# Patient Record
Sex: Male | Born: 1995 | Race: Black or African American | Hispanic: No | Marital: Single | Smoking: Never smoker
Health system: Southern US, Community
[De-identification: ages and names within clinical notes are randomized; demographics above are authoritative.]

## PROBLEM LIST (undated history)

## (undated) DIAGNOSIS — S0003XA Contusion of scalp, initial encounter: Secondary | ICD-10-CM

## (undated) DIAGNOSIS — R51 Headache: Secondary | ICD-10-CM

---
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
## 2014-07-12 ENCOUNTER — Emergency Department (HOSPITAL_BASED_OUTPATIENT_CLINIC_OR_DEPARTMENT_OTHER): Payer: Self-pay

## 2014-07-12 ENCOUNTER — Encounter (HOSPITAL_BASED_OUTPATIENT_CLINIC_OR_DEPARTMENT_OTHER): Payer: Self-pay | Admitting: *Deleted

## 2014-07-12 ENCOUNTER — Emergency Department (HOSPITAL_BASED_OUTPATIENT_CLINIC_OR_DEPARTMENT_OTHER)
Admission: EM | Admit: 2014-07-12 | Discharge: 2014-07-12 | Disposition: A | Discharge status: Home / Self Care | Payer: Self-pay | Attending: Emergency Medicine | Admitting: Emergency Medicine

## 2014-07-12 DIAGNOSIS — Y9289 Other specified places as the place of occurrence of the external cause: Secondary | ICD-10-CM | POA: Insufficient documentation

## 2014-07-12 DIAGNOSIS — R51 Headache: Secondary | ICD-10-CM

## 2014-07-12 DIAGNOSIS — S0990XA Unspecified injury of head, initial encounter: Secondary | ICD-10-CM | POA: Insufficient documentation

## 2014-07-12 DIAGNOSIS — R519 Headache, unspecified: Secondary | ICD-10-CM

## 2014-07-12 DIAGNOSIS — S41112A Laceration without foreign body of left upper arm, initial encounter: Secondary | ICD-10-CM | POA: Insufficient documentation

## 2014-07-12 DIAGNOSIS — S20319A Abrasion of unspecified front wall of thorax, initial encounter: Secondary | ICD-10-CM | POA: Insufficient documentation

## 2014-07-12 DIAGNOSIS — Z23 Encounter for immunization: Secondary | ICD-10-CM | POA: Insufficient documentation

## 2014-07-12 DIAGNOSIS — Z79899 Other long term (current) drug therapy: Secondary | ICD-10-CM | POA: Insufficient documentation

## 2014-07-12 DIAGNOSIS — Y9389 Activity, other specified: Secondary | ICD-10-CM | POA: Insufficient documentation

## 2014-07-12 DIAGNOSIS — S0181XA Laceration without foreign body of other part of head, initial encounter: Secondary | ICD-10-CM | POA: Insufficient documentation

## 2014-07-12 DIAGNOSIS — S1091XA Abrasion of unspecified part of neck, initial encounter: Secondary | ICD-10-CM | POA: Insufficient documentation

## 2014-07-12 DIAGNOSIS — Y998 Other external cause status: Secondary | ICD-10-CM | POA: Insufficient documentation

## 2014-07-12 MED ORDER — LIDOCAINE HCL (PF) 2 % IJ SOLN
INTRAMUSCULAR | Status: AC
  Filled 2014-07-12: qty 4

## 2014-07-12 MED ORDER — TETANUS-DIPHTH-ACELL PERTUSSIS 5-2.5-18.5 LF-MCG/0.5 IM SUSP
INTRAMUSCULAR | Status: AC
  Filled 2014-07-12: qty 0.5

## 2014-07-12 MED ORDER — ONDANSETRON 4 MG PO TBDP
4.0000 mg | ORAL_TABLET | Freq: Once | ORAL | Status: AC
  Administered 2014-07-12: 4 mg via ORAL

## 2014-07-12 MED ORDER — ONDANSETRON 4 MG PO TBDP
ORAL_TABLET | ORAL | Status: AC
  Filled 2014-07-12: qty 1

## 2014-07-12 MED ORDER — IBUPROFEN 800 MG PO TABS
800.0000 mg | ORAL_TABLET | Freq: Once | ORAL | Status: AC
  Administered 2014-07-12: 800 mg via ORAL
  Filled 2014-07-12: qty 1

## 2014-07-12 MED ORDER — LIDOCAINE HCL 2 % IJ SOLN
10.0000 mL | Freq: Once | INTRAMUSCULAR | Status: AC
  Administered 2014-07-12: 200 mg
  Filled 2014-07-12: qty 20

## 2014-07-12 MED ORDER — TETANUS-DIPHTHERIA TOXOIDS TD 5-2 LFU IM INJ
INJECTION | INTRAMUSCULAR | Status: AC
  Administered 2014-07-12: 0.5 mL via INTRAMUSCULAR
  Filled 2014-07-12: qty 0.5

## 2014-07-12 NOTE — ED Notes (Signed)
Pt and Family verbalize understanding of d/c instructions. Reminded him to Keep hands off lacerations.
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
## 2014-07-12 NOTE — ED Notes (Signed)
Pt given TDAP shot prior to D/C.  Per MD's order

## 2014-07-12 NOTE — ED Notes (Signed)
MD at bedside suturing

## 2014-07-12 NOTE — Discharge Instructions (Signed)
Patient is advised to keep wounds clean and covered for the next 24 hours. Thereafter gentle wash with warm water and soap confused followed by bacitracin ointment, was covering after. Continue this for 3 days until wounds are closed. Other wounds were left open as they were not able to be closed though should be cleaned tonight with warm water and soap followed by bacitracin, continue this daily suture should be removed the face 4-5 days to avoid scarring or crusting over. Sutures on the hand should be left for 7 days. Please monitor for signs of infection including fever, redness, warmth, discharge, swelling if any of the above symptoms present please seek immediate medical care. Continue using over-the-counter medication for her headache monitor for worsening signs or symptoms and return if any present.
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
## 2014-07-18 ENCOUNTER — Encounter (HOSPITAL_BASED_OUTPATIENT_CLINIC_OR_DEPARTMENT_OTHER): Payer: Self-pay

## 2014-07-18 ENCOUNTER — Emergency Department (HOSPITAL_BASED_OUTPATIENT_CLINIC_OR_DEPARTMENT_OTHER)
Admission: EM | Admit: 2014-07-18 | Discharge: 2014-07-18 | Disposition: A | Discharge status: Home / Self Care | Payer: Self-pay | Attending: Emergency Medicine | Admitting: Emergency Medicine

## 2014-07-18 DIAGNOSIS — Z87828 Personal history of other (healed) physical injury and trauma: Secondary | ICD-10-CM | POA: Insufficient documentation

## 2014-07-18 DIAGNOSIS — Z79899 Other long term (current) drug therapy: Secondary | ICD-10-CM | POA: Insufficient documentation

## 2014-07-18 DIAGNOSIS — Z4802 Encounter for removal of sutures: Secondary | ICD-10-CM | POA: Insufficient documentation

## 2014-07-18 NOTE — Discharge Instructions (Signed)
If you were given medicines take as directed.  If you are on coumadin or contraceptives realize their levels and effectiveness is altered by many different medicines.  If you have any reaction (rash, tongues swelling, other) to the medicines stop taking and see a physician.   Please follow up as directed and return to the ER or see a physician for new or worsening symptoms.  Thank you. Filed Vitals:   07/18/14 1329  BP: 136/74  Pulse: 53  Temp: 98.5 F (36.9 C)  TempSrc: Oral  Resp: 16  Height: 6' (1.829 m)  Weight: 185 lb (83.915 kg)  SpO2: 99%
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
## 2014-07-18 NOTE — ED Notes (Signed)
For suture removal-placed 4/26-area around right eye and left finger x 2

## 2015-07-23 ENCOUNTER — Encounter (HOSPITAL_BASED_OUTPATIENT_CLINIC_OR_DEPARTMENT_OTHER): Payer: Self-pay | Admitting: *Deleted

## 2015-07-23 ENCOUNTER — Emergency Department (HOSPITAL_BASED_OUTPATIENT_CLINIC_OR_DEPARTMENT_OTHER)
Admission: EM | Admit: 2015-07-23 | Discharge: 2015-07-24 | Disposition: A | Discharge status: Home / Self Care | Payer: Self-pay | Attending: Emergency Medicine | Admitting: Emergency Medicine

## 2015-07-23 DIAGNOSIS — R112 Nausea with vomiting, unspecified: Secondary | ICD-10-CM | POA: Insufficient documentation

## 2015-07-23 DIAGNOSIS — G43009 Migraine without aura, not intractable, without status migrainosus: Secondary | ICD-10-CM

## 2015-07-23 DIAGNOSIS — R51 Headache: Secondary | ICD-10-CM | POA: Insufficient documentation

## 2015-07-23 DIAGNOSIS — H53149 Visual discomfort, unspecified: Secondary | ICD-10-CM | POA: Insufficient documentation

## 2015-07-23 MED ORDER — DEXAMETHASONE SODIUM PHOSPHATE 10 MG/ML IJ SOLN
10.0000 mg | Freq: Once | INTRAMUSCULAR | Status: AC
  Administered 2015-07-24: 10 mg via INTRAVENOUS
  Filled 2015-07-23: qty 1

## 2015-07-23 MED ORDER — METOCLOPRAMIDE HCL 5 MG/ML IJ SOLN
10.0000 mg | Freq: Once | INTRAMUSCULAR | Status: AC
  Administered 2015-07-24: 10 mg via INTRAVENOUS
  Filled 2015-07-23: qty 2

## 2015-07-23 MED ORDER — DIPHENHYDRAMINE HCL 50 MG/ML IJ SOLN
25.0000 mg | Freq: Once | INTRAMUSCULAR | Status: AC
  Administered 2015-07-24: 25 mg via INTRAVENOUS
  Filled 2015-07-23: qty 1

## 2015-07-23 MED ORDER — KETOROLAC TROMETHAMINE 30 MG/ML IJ SOLN
30.0000 mg | Freq: Once | INTRAMUSCULAR | Status: AC
  Administered 2015-07-24: 30 mg via INTRAVENOUS
  Filled 2015-07-23: qty 1
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
## 2015-07-23 NOTE — ED Provider Notes (Signed)
CSN: [id number]     Arrival date & time 07/23/15  2332 History  By signing my name below, I, [name], attest that this documentation has been prepared under the direction and in the presence of Thiago Ragsdale, MD. Electronically Signed: [name], ED Scribe. 07/23/2015. 11:56 PM.  Chief Complaint  Patient presents with  . Headache   Patient is a 20 y.o. male presenting with headaches. The history is provided by the patient. No language interpreter was used.  Headache Pain location:  L temporal Quality:  Sharp Radiates to:  Does not radiate Onset quality:  Sudden Duration:  2 hours Timing:  Constant Progression:  Worsening Chronicity:  New Similar to prior headaches: yes   Context: bright light   Ineffective treatments:  Acetaminophen Associated symptoms: nausea, photophobia and vomiting   Associated symptoms: no dizziness, no fever, no neck pain, no neck stiffness, no numbness, no seizures and no weakness   HPI Comments: Joe Wright is a 20 y.o. male with a hx of headache who presents to the Emergency Department complaining of a gradually worsening, sharp left temporal headache onset 2 hours ago. Pt reports associated nausea, vomiting, and photophobia. This headache is similar to past headaches that he has had. Pt has taken Zyrtec and Tylenol for pain to no relief. He denies other symptoms. Not sudden onset not the worst headache of his life.  No neck pain or stiffness.  No atypia.  No fevers no focal neuro deficits.    Past Medical History  Diagnosis Date  . 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 Headache(784.0)    History reviewed. No pertinent past surgical history. History reviewed. No pertinent family history. Social History  Substance Use Topics  . Smoking status: Never Smoker   . Smokeless tobacco: None  . Alcohol Use: No    Review of Systems  Constitutional: Negative for fever.  Eyes: Positive for photophobia.  Gastrointestinal: Positive for nausea and  vomiting.  Musculoskeletal: Negative for neck pain and neck stiffness.  Skin: Negative for rash.  Neurological: Positive for headaches. Negative for dizziness, seizures, syncope, facial asymmetry, speech difficulty, weakness, light-headedness and numbness.  All other systems reviewed and are negative.     Allergies  Review of patient's allergies indicates no known allergies.  Home Medications   Prior to Admission medications   Medication Sig Start Date End Date Taking? Authorizing Provider  cetirizine (ZYRTEC) 10 MG tablet Take 10 mg by mouth daily.      Historical Provider, MD  Homeopathic Products [facility](ZICAM ALLERGY RELIEF) GEL Place into the nose.      Historical Provider, MD  ibuprofen (ADVIL,MOTRIN) 200 MG tablet Take 200 mg by mouth every 6 (six) hours as needed.      Historical Provider, MD   BP 137/91 mmHg  Pulse 76  Temp(Src) 98 F (36.7 C) (Oral)  Resp 16  Ht 6' (1.829 m)  Wt 185 lb (83.915 kg)  BMI 25.08 kg/m2  SpO2 97% Physical Exam  Constitutional: He is oriented to person, place, and time. He appears well-developed and well-nourished. No distress.  HENT:  Head: Normocephalic and atraumatic.  Mouth/Throat: Oropharynx is clear and moist. No oropharyngeal exudate.  Trachea midline  Eyes: Conjunctivae and EOM are normal. Pupils are equal, round, and reactive to light.  Neck: Trachea normal and normal range of motion. Neck supple. No JVD present. Carotid bruit is not present.  Cardiovascular: Normal rate and regular rhythm.  Exam reveals no gallop and no  friction rub.   No murmur heard. Pulmonary/Chest: Effort normal and breath sounds normal. No stridor. He has no wheezes. He has no rales.  Abdominal: Soft. Bowel sounds are normal. He exhibits no mass. There is no tenderness. There is no rebound and no guarding.  Musculoskeletal: Normal range of motion.  Lymphadenopathy:    He has no cervical adenopathy.  Neurological: He is alert and oriented to person, place, and  time. He has normal reflexes. No cranial nerve deficit. He exhibits normal muscle tone. Coordination normal.  Cranial nerves 2-12 intact  Skin: Skin is warm and dry. He is not diaphoretic.  Psychiatric: He has a normal mood and affect. His behavior is normal.    ED Course  Procedures (including critical care time) DIAGNOSTIC STUDIES: Oxygen Saturation is 97% on RA, normal by my interpretation.    COORDINATION OF CARE: 11:56 PM-Discussed treatment plan with pt at bedside and pt agreed to plan.    Labs Review Labs Reviewed - No data to display  Imaging Review No results found. I have personally reviewed and evaluated these images and lab results as part of my medical decision-making.   EKG Interpretation None      MDM   Filed Vitals:   07/23/15 2336  BP: 137/91  Pulse: 76  Temp: 98 F (36.7 C)  Resp: 16   Medications - No data to display  Final diagnoses:  None   Filed Vitals:   07/24/15 0004 07/24/15 0030  BP:  130/77  Pulse: 51 65  Temp:    Resp:     Medications  ketorolac (TORADOL) 30 MG/ML injection 30 mg (30 mg Intravenous Given 07/24/15 0012)  dexamethasone (DECADRON) injection 10 mg (10 mg Intravenous Given 07/24/15 0012)  metoCLOPramide (REGLAN) injection 10 mg (10 mg Intravenous Given 07/24/15 0013)  diphenhydrAMINE (BENADRYL) injection 25 mg (25 mg Intravenous Given 07/24/15 0012)   Well appearing.  No atypia.  Has same headaches frequently.  Sleeping soundly post medication.  No indication for CT or LP at this time.  Stable for discharge with close follow up.  Strict return precautions  I personally performed the services described in this documentation, which was scribed in my presence. The recorded information has been reviewed and is accurate.      Cy Blamer, MD 07/24/15 [phone]

## 2015-07-24 ENCOUNTER — Encounter (HOSPITAL_BASED_OUTPATIENT_CLINIC_OR_DEPARTMENT_OTHER): Payer: Self-pay | Admitting: Emergency Medicine

## 2015-07-24 MED ORDER — NAPROXEN 375 MG PO TABS: 375.0000 mg | ORAL_TABLET | Freq: Two times a day (BID) | ORAL | Status: DC

## 2015-07-24 NOTE — Discharge Instructions (Signed)

## 2015-12-18 ENCOUNTER — Emergency Department (HOSPITAL_BASED_OUTPATIENT_CLINIC_OR_DEPARTMENT_OTHER)
Admission: EM | Admit: 2015-12-18 | Discharge: 2015-12-19 | Disposition: A | Discharge status: Home / Self Care | Payer: No Typology Code available for payment source | Attending: Emergency Medicine | Admitting: Emergency Medicine

## 2015-12-18 ENCOUNTER — Encounter (HOSPITAL_BASED_OUTPATIENT_CLINIC_OR_DEPARTMENT_OTHER): Payer: Self-pay

## 2015-12-18 DIAGNOSIS — R51 Headache: Secondary | ICD-10-CM | POA: Insufficient documentation

## 2015-12-18 DIAGNOSIS — Y9389 Activity, other specified: Secondary | ICD-10-CM | POA: Diagnosis not present

## 2015-12-18 DIAGNOSIS — Y92410 Unspecified street and highway as the place of occurrence of the external cause: Secondary | ICD-10-CM | POA: Insufficient documentation

## 2015-12-18 DIAGNOSIS — Z041 Encounter for examination and observation following transport accident: Secondary | ICD-10-CM | POA: Diagnosis present

## 2015-12-18 DIAGNOSIS — R110 Nausea: Secondary | ICD-10-CM | POA: Diagnosis not present

## 2015-12-18 DIAGNOSIS — M25512 Pain in left shoulder: Secondary | ICD-10-CM | POA: Insufficient documentation

## 2015-12-18 DIAGNOSIS — S70312A Abrasion, left thigh, initial encounter: Secondary | ICD-10-CM | POA: Diagnosis not present

## 2015-12-18 DIAGNOSIS — Y999 Unspecified external cause status: Secondary | ICD-10-CM | POA: Diagnosis not present

## 2015-12-18 MED ORDER — IBUPROFEN 800 MG PO TABS
800.0000 mg | ORAL_TABLET | Freq: Once | ORAL | Status: AC
  Administered 2015-12-18: 800 mg via ORAL
  Filled 2015-12-18: qty 1
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
## 2015-12-19 MED ORDER — IBUPROFEN 800 MG PO TABS: 800.0000 mg | ORAL_TABLET | Freq: Three times a day (TID) | ORAL | 0 refills | Status: DC

## 2015-12-19 MED ORDER — METHOCARBAMOL 500 MG PO TABS: 500.0000 mg | ORAL_TABLET | Freq: Two times a day (BID) | ORAL | 0 refills | Status: DC

## 2015-12-25 ENCOUNTER — Emergency Department (HOSPITAL_BASED_OUTPATIENT_CLINIC_OR_DEPARTMENT_OTHER)
Admission: EM | Admit: 2015-12-25 | Discharge: 2015-12-26 | Disposition: A | Discharge status: Home / Self Care | Payer: No Typology Code available for payment source | Attending: Emergency Medicine | Admitting: Emergency Medicine

## 2015-12-25 ENCOUNTER — Emergency Department (HOSPITAL_BASED_OUTPATIENT_CLINIC_OR_DEPARTMENT_OTHER): Payer: No Typology Code available for payment source

## 2015-12-25 ENCOUNTER — Encounter (HOSPITAL_BASED_OUTPATIENT_CLINIC_OR_DEPARTMENT_OTHER): Payer: Self-pay | Admitting: *Deleted

## 2015-12-25 DIAGNOSIS — M25552 Pain in left hip: Secondary | ICD-10-CM | POA: Diagnosis not present

## 2015-12-25 DIAGNOSIS — M542 Cervicalgia: Secondary | ICD-10-CM | POA: Diagnosis not present

## 2015-12-25 DIAGNOSIS — Y999 Unspecified external cause status: Secondary | ICD-10-CM | POA: Insufficient documentation

## 2015-12-25 DIAGNOSIS — Y9389 Activity, other specified: Secondary | ICD-10-CM | POA: Insufficient documentation

## 2015-12-25 DIAGNOSIS — R51 Headache: Secondary | ICD-10-CM | POA: Diagnosis not present

## 2015-12-25 DIAGNOSIS — Y92410 Unspecified street and highway as the place of occurrence of the external cause: Secondary | ICD-10-CM | POA: Insufficient documentation

## 2015-12-25 DIAGNOSIS — S299XXA Unspecified injury of thorax, initial encounter: Secondary | ICD-10-CM | POA: Diagnosis not present

## 2015-12-25 DIAGNOSIS — M7918 Myalgia, other site: Secondary | ICD-10-CM

## 2015-12-25 DIAGNOSIS — M25512 Pain in left shoulder: Secondary | ICD-10-CM | POA: Diagnosis not present

## 2015-12-25 DIAGNOSIS — S298XXA Other specified injuries of thorax, initial encounter: Secondary | ICD-10-CM

## 2015-12-25 MED ORDER — IBUPROFEN 800 MG PO TABS
800.0000 mg | ORAL_TABLET | Freq: Once | ORAL | Status: AC
  Administered 2015-12-26: 800 mg via ORAL
  Filled 2015-12-25: qty 1
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
## 2015-12-26 MED ORDER — IBUPROFEN 600 MG PO TABS: 600.0000 mg | ORAL_TABLET | Freq: Three times a day (TID) | ORAL | 0 refills | Status: DC | PRN
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
## 2016-12-30 ENCOUNTER — Emergency Department (HOSPITAL_BASED_OUTPATIENT_CLINIC_OR_DEPARTMENT_OTHER)
Admission: EM | Admit: 2016-12-30 | Discharge: 2016-12-30 | Disposition: A | Discharge status: Home / Self Care | Payer: Self-pay | Attending: Emergency Medicine | Admitting: Emergency Medicine

## 2016-12-30 ENCOUNTER — Encounter (HOSPITAL_BASED_OUTPATIENT_CLINIC_OR_DEPARTMENT_OTHER): Payer: Self-pay

## 2016-12-30 DIAGNOSIS — M25562 Pain in left knee: Secondary | ICD-10-CM | POA: Insufficient documentation

## 2016-12-30 MED ORDER — MELOXICAM 15 MG PO TABS: 15.0000 mg | ORAL_TABLET | Freq: Every day | ORAL | 0 refills | Status: AC

## 2016-12-30 NOTE — ED Notes (Signed)
Pt verbalizes understanding of d/c instructions and denies any further needs at this time. 

## 2016-12-30 NOTE — ED Notes (Signed)
EDP is at bedside. 
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
## 2016-12-30 NOTE — Discharge Instructions (Signed)
Please read attached information regarding your condition. Take Mobic once daily as directed as needed for pain. Use crutches as directed but bear weight as tolerated. Follow-up with your orthopedist for further evaluation. Return to ED for worsening pain, additional injury, trouble walking, numbness in legs.

## 2016-12-30 NOTE — ED Provider Notes (Signed)
[facility] EMERGENCY DEPARTMENT Provider Note   CSN: [id number] Arrival date & time: 12/30/16  1859     History   Chief Complaint Chief Complaint  Patient presents with  . Leg Injury    HPI Joe Wright is a 21 y.o. male.  HPI Patient presents to ED for evaluation of left knee pain for the past 4 days. He injured it at work 4 days ago hen he accidentally hit the front of his knee on a metal post height. He was evaluated in [place] with x-rays done. States that he was put on crutches and given naproxen. States that x-rays were negative. He is scheduled to follow up with his orthopedist tomorrow. She has been taking naproxen with mild relief in symptoms. Patient denies any additional injury, numbness. He has been bearing weight as tolerated.  Past Medical History:  Diagnosis Date  . Headache(784.0)   . 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   History reviewed. No pertinent surgical history.     Home Medications    Prior to Admission medications   Medication Sig Start Date End Date Taking? Authorizing Provider  ETODOLAC PO Take by mouth.   Yes [provider]  meloxicam (MOBIC) 15 MG tablet Take 1 tablet (15 mg total) by mouth daily. 12/30/16   Dietrich Pates, PA-C    Family History No family history on file.  Social History Social History  Substance Use Topics  . Smoking status: Never Smoker  . Smokeless tobacco: Never Used  . Alcohol use No     Allergies   Patient has no known allergies.   Review of Systems Review of Systems  Constitutional: Negative for chills and fever.  Gastrointestinal: Negative for nausea and vomiting.  Musculoskeletal: Positive for arthralgias. Negative for gait problem, joint swelling, neck pain and neck stiffness.  Skin: Negative for rash and wound.  Neurological: Negative for weakness and numbness.     Physical Exam Updated Vital Signs BP (!) 144/94  (BP Location: Right Arm)   Pulse 64   Temp 99.2 F (37.3 C) (Oral)   Resp 20   Ht 6' (1.829 m)   Wt 88.5 kg (195 lb)   SpO2 99%   BMI 26.45 kg/m   Physical Exam  Constitutional: He appears well-developed and well-nourished. No distress.  HENT:  Head: Normocephalic and atraumatic.  Eyes: Conjunctivae and EOM are normal. No scleral icterus.  Neck: Normal range of motion.  Pulmonary/Chest: Effort normal. No respiratory distress.  Musculoskeletal: Normal range of motion. He exhibits tenderness. He exhibits no edema or deformity.       Legs: Tenderness to palpation of the left knee below the patella. Pain with flexion and extension of the knee. No visible swelling, color or temperature change noted. Sensation intact to light touch of bilateral lower extremities. Strength 5/5 in bilateral lower extremities.  Neurological: He is alert.  Skin: No rash noted. He is not diaphoretic.  Psychiatric: He has a normal mood and affect.  Nursing note and vitals reviewed.    ED Treatments / Results  Labs (all labs ordered are listed, but only abnormal results are displayed) Labs Reviewed - No data to display  EKG  EKG Interpretation None       Radiology No results found.  Procedures Procedures (including critical care time)  Medications Ordered in ED Medications - No data to display   Initial Impression / Assessment and  Plan / ED Course  I have reviewed the triage vital signs and the nursing notes.  Pertinent labs & imaging results that were available during my care of the patient were reviewed by me and considered in my medical decision making (see chart for details).     Patient presents to ED for evaluation of left knee pain for the past 4 days. He injured the knee at work 4 days ago when he asked The front of his knee on a metal object. He was evaluated in [place] with x-rays done which returned as negative. He reports some improvement in his symptoms with naproxen and  crutches but he is continuing to have pain with weightbearing. Physical exam he does have tenderness to palpation below the left patella with no visible bruising, edema, color or temperature change that would concern me for infectious joint. He is scheduled to follow up with his orthopedist tomorrow. He denies any reinjury or falls. He declines any additional x-ray at this time. I suspect that his symptoms are due to a contusion in the area. I think that he will benefit from a knee brace and anti-inflammatories. He states that he was also like a note for work. He states that he will follow-up with his orthopedist tomorrow for further evaluation. I encouraged him to bear weight on the extremity as tolerated. he is hypertensive here in the ED but he declines admission or medication at this time and states that he'll follow-up with PCP for further evaluation of this. This could partly be due to his discomfort and pain as well. Patient appears stable for discharge at this time. Strict return precautions given.  Final Clinical Impressions(s) / ED Diagnoses   Final diagnoses:  Acute pain of left knee    New Prescriptions Discharge Medication List as of 12/30/2016  9:03 PM    START taking these medications   Details  meloxicam (MOBIC) 15 MG tablet Take 1 tablet (15 mg total) by mouth daily., Starting Mon 12/30/2016, [address] [place], PA-C 12/31/16 1610    Gwyneth Sprout, MD 12/31/16 2212
